# Patient Record
Sex: Female | Born: 1965 | Race: White | Hispanic: No | Marital: Married | Smoking: Never smoker
Health system: Southern US, Community
[De-identification: ages and names within clinical notes are randomized; demographics above are authoritative.]

## PROBLEM LIST (undated history)

## (undated) HISTORY — PX: ABDOMINAL HYSTERECTOMY: SHX81

---

## 1998-03-27 ENCOUNTER — Other Ambulatory Visit: Admission: RE | Admit: 1998-03-27 | Discharge: 1998-03-27 | Payer: Self-pay | Admitting: Obstetrics and Gynecology

## 1999-03-31 ENCOUNTER — Other Ambulatory Visit: Admission: RE | Admit: 1999-03-31 | Discharge: 1999-03-31 | Payer: Self-pay | Admitting: Obstetrics and Gynecology

## 2000-04-08 ENCOUNTER — Other Ambulatory Visit: Admission: RE | Admit: 2000-04-08 | Discharge: 2000-04-08 | Payer: Self-pay | Admitting: Obstetrics and Gynecology

## 2001-05-05 ENCOUNTER — Other Ambulatory Visit: Admission: RE | Admit: 2001-05-05 | Discharge: 2001-05-05 | Payer: Self-pay | Admitting: Obstetrics and Gynecology

## 2002-04-10 ENCOUNTER — Encounter: Admission: RE | Admit: 2002-04-10 | Discharge: 2002-04-10 | Payer: Self-pay | Admitting: Obstetrics and Gynecology

## 2002-04-10 ENCOUNTER — Encounter: Payer: Self-pay | Admitting: Obstetrics and Gynecology

## 2002-07-03 ENCOUNTER — Other Ambulatory Visit: Admission: RE | Admit: 2002-07-03 | Discharge: 2002-07-03 | Payer: Self-pay | Admitting: Obstetrics and Gynecology

## 2003-09-27 ENCOUNTER — Other Ambulatory Visit: Admission: RE | Admit: 2003-09-27 | Discharge: 2003-09-27 | Payer: Self-pay | Admitting: Obstetrics and Gynecology

## 2004-10-21 ENCOUNTER — Other Ambulatory Visit: Admission: RE | Admit: 2004-10-21 | Discharge: 2004-10-21 | Payer: Self-pay | Admitting: Obstetrics and Gynecology

## 2005-11-18 ENCOUNTER — Other Ambulatory Visit: Admission: RE | Admit: 2005-11-18 | Discharge: 2005-11-18 | Payer: Self-pay | Admitting: Obstetrics and Gynecology

## 2007-03-02 ENCOUNTER — Encounter (INDEPENDENT_AMBULATORY_CARE_PROVIDER_SITE_OTHER): Payer: Self-pay | Admitting: Obstetrics and Gynecology

## 2007-03-02 ENCOUNTER — Ambulatory Visit (HOSPITAL_COMMUNITY): Admission: RE | Admit: 2007-03-02 | Discharge: 2007-03-02 | Payer: Self-pay | Admitting: Obstetrics and Gynecology

## 2007-08-16 ENCOUNTER — Encounter: Admission: RE | Admit: 2007-08-16 | Discharge: 2007-08-24 | Payer: Self-pay | Admitting: Sports Medicine

## 2008-08-22 ENCOUNTER — Ambulatory Visit: Payer: Self-pay | Admitting: Diagnostic Radiology

## 2008-08-22 ENCOUNTER — Ambulatory Visit (HOSPITAL_BASED_OUTPATIENT_CLINIC_OR_DEPARTMENT_OTHER): Admission: RE | Admit: 2008-08-22 | Discharge: 2008-08-22 | Payer: Self-pay | Admitting: Family Medicine

## 2010-12-15 NOTE — H&P (Signed)
Casey Hammond, Casey Hammond            ACCOUNT NO.:  [id number]   MEDICAL RECORD NO.:  [id number]          PATIENT TYPE:  AMB   LOCATION:  SDC                           FACILITY:  [facility]   PHYSICIAN:  Guy Sandifer. Henderson Cloud, M.D. DATE OF BIRTH:  Jul 11, 1966   DATE OF ADMISSION:  03/02/2007  DATE OF DISCHARGE:                              HISTORY & PHYSICAL   CHIEF COMPLAINT:  Heavy bleeding and leaking urine.   HISTORY OF PRESENT ILLNESS:  The patient is a 45 year old married white  female, G2, P2, having regular but very heavy menses. She also leaks  urine when she coughs and sneezes. Ultrasound in my office on January 04, 2007 reveals a uterus measuring 9.3 x 4.0 x 6.1 cm. There is a 2.4 cm.  intramural fibroid. Sonohysterogram is negative for intracavitary masses  and the ovaries appear normal. Urodynamic studies are consistent with  stress urinary incontinence. After discussion of options, she is being  admitted for laparoscopy with tubal ligation, hysteroscopy, D&C,  NovaSure endometrial ablation, and trans-obturator mid-urethral sling.  Potential risks and complications have been reviewed with the patient  preoperatively.   PAST MEDICAL HISTORY:  Esophageal reflux.   PAST SURGICAL HISTORY:  Negative.   FAMILY HISTORY:  Coronary artery disease in mother, maternal  grandfather, and paternal grandfather. Chronic hypertension in mother.  Lung cancer, aunt.   OBSTETRIC HISTORY:  Vaginal delivery x2.   SOCIAL HISTORY:  Denies tobacco, alcohol, or drug abuse.   MEDICATIONS:  Aspirin and Claritin p.r.n.   ALLERGIES:  PENICILLIN.   REVIEW OF SYSTEMS:  NEUROLOGIC:  Denies headache. CARDIAC:  Denies chest  pain. PULMONARY:  Denies shortness of breath. GASTROINTESTINAL:  Denies  recent changes in bowel habits.   PHYSICAL EXAMINATION:  VITAL SIGNS:  Height 5 feet 4 inches. Weight  125.6 pounds. Blood pressure 104/68.  HEENT:  Without thyromegaly.  LUNGS:  Clear to auscultation.  HEART:   Regular rate and rhythm.  BACK:  Without CVA tenderness.  BREAST:  Without mass or discharge.  ABDOMEN:  Soft, nontender, without masses.  PELVIC:  Vulva, vagina, and cervix without lesion.  UTERUS:  Upper normal size, mobile, and nontender. Adnexa nontender  without masses.  EXTREMITIES:  Grossly within normal limits.  NEUROLOGIC:  Grossly within normal limits.   ASSESSMENT:  1. Menorrhagia.  2. Stress urinary incontinence.   PLAN:  Laparoscopy tubal ligation with Filshie clips, hysteroscopy, D&C,  NovaSure endometrial ablation and trans-obturator mid urethral sling.      Guy Sandifer Henderson Cloud, M.D.  Electronically Signed     JET/MEDQ  D:  02/13/2007  T:  02/13/2007  Job:  [id number]

## 2010-12-15 NOTE — Op Note (Signed)
Casey Hammond, Casey Hammond            ACCOUNT NO.:  [id number]   MEDICAL RECORD NO.:  [id number]          PATIENT TYPE:  AMB   LOCATION:  SDC                           FACILITY:  [facility]   PHYSICIAN:  Guy Sandifer. Henderson Cloud, M.D. DATE OF BIRTH:  14-Jun-1966   DATE OF PROCEDURE:  03/02/2007  DATE OF DISCHARGE:                               OPERATIVE REPORT   PREOPERATIVE DIAGNOSIS:  1. Menorrhagia.  2. Stress urinary continence  3. Desires permanent sterilization.   POSTOPERATIVE DIAGNOSIS:  1. Menorrhagia.  2. Stress urinary continence  3. Desires permanent sterilization.  4. Pelvic adhesions.   PROCEDURE:  Transobturator mid urethral sling, cystoscopy, NovaSure  endometrial ablation, hysteroscopy, dilatation and curettage,  laparoscopy with bilateral tubal ligation with Filshie clips, lysis of  adhesions, and 1% Xylocaine paracervical block.   SURGEON:  Guy Sandifer. Henderson Cloud, M.D.   ANESTHESIA:  General with endotracheal intubation.   SPECIMENS:  Endometrial curettings.   ESTIMATED BLOOD LOSS:  50 mL.   INPUT AND OUTPUT OF DISTENDING MEDIA:  75 mL deficit with part of that  on the floor.   INDICATIONS AND CONSENT:  This patient is a 45 year old married white  female G2, P2, having irregular heavy menses.  She is also having  complaints of leaking urine.  Urodynamics is consistent with stress  urinary incontinence.  Details are dictated in the history and physical.  After discussing the options, she is being admitted for hysteroscopy,  D&C, NovaSure endometrial ablation, transobturator mid urethral sling  with cystoscopy, laparoscopy, tubal ligation with Filshie clips.  The  potential risks and complications have been reviewed preoperatively  including but not limited to infection, organ damage, bleeding requiring  transfusion of blood products with possible transfusion reaction, HIV  and hepatitis acquisition, DVT, PE, pneumonia, fistula formation,  laparotomy, possible vaginal  erosion, dyspareunia, pelvic pain, urinary  retention, prolonged catheterization, prolonged self-catheterization, or  return to the operating room.  All questions have been answered and  consent is signed on the chart.   FINDINGS:  The uterine cavity is without abnormal structure.  The  fallopian tube ostia are identified bilaterally.  Abdominally, the upper  abdomen is grossly normal.  The uterus is six weeks in size.  The  anterior and posterior cul-de-sacs are without lesion.  Right tube and  ovary normal.  Left fallopian tube was normal.  Left ovary has a thick  adhesion to the posterior cul-de-sac.   DESCRIPTION OF PROCEDURE:  The patient is taken to the operating room  where she is identified, placed in the dorsal supine position, and  general anesthesia was induced via endotracheal intubation.  She is then  placed in the dorsal lithotomy position where she is prepped abdominally  and vaginally and draped in a sterile fashion.  The bladder is straight  catheterized, as well.  A bivalve speculum is placed in the vagina.  The  anterior cervical lip is injected with 1% Xylocaine and grasped with a  single tooth tenaculum.  A paracervical block with 1% plain Xylocaine is  placed at the 2, 4, 5, 7, 8, and 10 o'clock positions with approximately  20 mL total.  The uterine cervical length as well as the uterine cavity  length is measured.  The cervix was gently progressively dilated to a 31  dilator.  The diagnostic hysteroscope was placed in the endocervical  canal and advanced under direct visualization using distending media.  The above findings were noted.  The hysteroscope is withdrawn and sharp  curettage is carried out.  Reinspection with the hysteroscope reveals  the cavity to be clean.  The hysteroscope is again withdrawn.   The NovaSure endometrial ablation device is then placed.  After passing  the cavity test on the first attempt, endometrial ablation is carried  out for  approximately 90 seconds.  The device is then removed and seen  to be intact.  Reinspection with the hysteroscope again reveals the  cavity to be clean with no evidence of perforation and good ablated  effect throughout the cavity.  The bivalve speculum was then removed and  a weighted speculum was placed.  A Foley catheter was placed and the  bladder was drained.   The areas for incision for the transobturator skin incisions are marked.  Those areas along with the infraurethral vaginal mucosa are injected  with 0.5% lidocaine with 1:200,000 epinephrine.  The vulvar incisions  were made.  The anterior vaginal mucosa is incised under the urethra.  Dissection is carried out bilaterally.  Then, using the halo  transobturator needles, they are placed through both incisions to the  transobturator fossa.  Care was taken to keep the needle tip on the  pelvic bone and the needle tip passages followed with the examining  finger.  Both needles are exited through the suburethral incision.  There is no evidence of penetrating the vaginal mucosa.  The Foley  catheter is then removed.  Cystoscopy is carried out with the 70 degrees  scope.  A 360 degrees inspection reveals no evidence of foreign body  perforation.  A good puff of urine from the bilateral ureteral orifices  are noted.  The cystoscope is removed.  The Foley catheter is replaced  and the bladder is drained.  The sling is then placed on the needle tips  and withdrawn back through the skin incisions.  The sheath is then  removed.  Proper tensioning is placed by adjusting, noting that a Kelly  clamp placed under the sling can easily be rotated perpendicular to the  floor without any tension on the sling.  The vaginal mucosa is closed  with interrupted Monocryl suture.  The sling is trimmed at the level of  the skin and the skin incisions were closed with Dermabond.  The Foley  catheter was left in place and drained to a bag.   Attention is  then turned to the abdomen.  The infraumbilical area is  injected with 0.5% Marcaine as well as the suprapubic area in the  midline.  A small infraumbilical incision is made.  A disposable Veress  needle was placed on the first attempt without difficulty.  A normal  syringe drop test are noted.  2 liters of gas were insufflated under low  pressure with good tympany in the right upper quadrant.  The Veress  needle was removed.  A 10/11 XL bladeless disposable trocar sleeve was  then placed using direct visualization with the diagnostic laparoscopic.  After placement, the operative laparoscope was placed.  A small  suprapubic incision is made in the midline and a 5 mm XL bladeless  disposable trocar sleeve was placed under  direct visualization without  difficulty.  The above findings were noted.   The adhesive band of the left ovary is cauterized in its mid point with  bipolar cautery.  It is then taken down sharply without difficulty.  Good hemostasis is noted.  The right fallopian tube is identified from  cornu to fimbria.  A Filshie clip is placed on the proximal portion of  the fallopian tube.  A similar procedure is carried out on the left.  The Filshie clip applicator is then removed and careful inspection  reveals the entire width of the tube to be within the clip and heel of  the clip could be seen to the mesosalpinx bilaterally.  Good hemostasis  is noted all around.  The suprapubic trocar and sleeve was removed,  pneumoperitoneum is reduced, and the umbilical trocar sleeve was  removed.  The skin incision on the umbilicus was closed with interrupted  2-0 Vicryl.  Dermabond was placed on both incisions.  The Hulka  tenaculum is removed and no bleeding is noted.  All counts were correct.  The patient is awakened and taken to the recovery room in stable  condition.      Guy Sandifer Henderson Cloud, M.D.  Electronically Signed     JET/MEDQ  D:  03/02/2007  T:  03/02/2007  Job:  [id number]

## 2011-05-17 LAB — COMPREHENSIVE METABOLIC PANEL
ALT: 13
AST: 19
Albumin: 4.2
Alkaline Phosphatase: 49
BUN: 10
CO2: 26
Calcium: 9.5
Chloride: 108
Creatinine, Ser: 0.8
GFR calc Af Amer: >60
GFR calc non Af Amer: >60
Glucose, Bld: 95
Potassium: 4
Sodium: 139
Total Bilirubin: 1.2
Total Protein: 7.5

## 2011-05-17 LAB — CBC
HCT: 39.8
Hemoglobin: 13.3
MCHC: 33.5
MCV: 88.6
Platelets: 211
RBC: 4.5
RDW: 12.8
WBC: 5

## 2011-05-17 LAB — HCG, SERUM, QUALITATIVE: Preg, Serum: NEGATIVE

## 2012-12-12 ENCOUNTER — Other Ambulatory Visit: Payer: Self-pay | Admitting: Obstetrics and Gynecology

## 2014-11-15 ENCOUNTER — Other Ambulatory Visit: Payer: Self-pay | Admitting: Obstetrics and Gynecology

## 2014-11-18 LAB — CYTOLOGY - PAP

## 2014-11-19 ENCOUNTER — Other Ambulatory Visit: Payer: Self-pay | Admitting: Obstetrics and Gynecology

## 2014-11-19 DIAGNOSIS — R928 Other abnormal and inconclusive findings on diagnostic imaging of breast: Secondary | ICD-10-CM

## 2014-11-26 ENCOUNTER — Ambulatory Visit
Admission: RE | Admit: 2014-11-26 | Discharge: 2014-11-26 | Disposition: A | Discharge status: Home / Self Care | Payer: BC Managed Care – PPO | Source: Ambulatory Visit | Attending: Obstetrics and Gynecology | Admitting: Obstetrics and Gynecology

## 2014-11-26 DIAGNOSIS — R928 Other abnormal and inconclusive findings on diagnostic imaging of breast: Secondary | ICD-10-CM

## 2014-12-02 ENCOUNTER — Other Ambulatory Visit: Payer: Self-pay

## 2016-04-22 DIAGNOSIS — Z0000 Encounter for general adult medical examination without abnormal findings: Secondary | ICD-10-CM | POA: Insufficient documentation

## 2017-01-03 DIAGNOSIS — G43709 Chronic migraine without aura, not intractable, without status migrainosus: Secondary | ICD-10-CM | POA: Insufficient documentation

## 2017-01-03 DIAGNOSIS — IMO0002 Reserved for concepts with insufficient information to code with codable children: Secondary | ICD-10-CM | POA: Insufficient documentation

## 2017-03-17 DIAGNOSIS — F419 Anxiety disorder, unspecified: Secondary | ICD-10-CM | POA: Insufficient documentation

## 2018-04-03 ENCOUNTER — Ambulatory Visit: Payer: Self-pay | Admitting: Adult Health

## 2018-04-03 VITALS — BP 113/67 | HR 66 | Temp 98.1°F | Resp 16 | Wt 127.8 lb

## 2018-04-03 DIAGNOSIS — H6692 Otitis media, unspecified, left ear: Secondary | ICD-10-CM

## 2018-04-03 DIAGNOSIS — J029 Acute pharyngitis, unspecified: Secondary | ICD-10-CM

## 2018-04-03 MED ORDER — AZITHROMYCIN 250 MG PO TABS: ORAL_TABLET | ORAL | 0 refills | Status: DC

## 2018-04-03 NOTE — Progress Notes (Signed)
Subjective:     Patient ID: Casey Hammond, female   DOB: 1965/12/17, 52 y.o.   MRN: [id number]  HPI  Blood pressure 113/67, pulse 66, temperature 98.1 F (36.7 C), temperature source Tympanic, resp. rate 16, weight 127 lb 12.8 oz (58 kg), SpO2 100 %. Patient is a 52 year old female in no acute distress who comes to the clinic with complaints of mild fatigue, cold symptoms for past two weeks. She reports sore throat pain. Denies any difficulty swallowing.   Patient  denies any fever, body aches,chills, rash, chest pain, shortness of breath, nausea, vomiting, or diarrhea.  Mild diarrhea intermittent with gluten and daily- denies any today.  She has been taking Zyrtec daily for years.   Patient  denies any fever, body aches,chills, rash, chest pain, shortness of breath, nausea, vomiting, or diarrhea.    Allergies  Allergen Reactions  . Penicillins Hives    Unknown Unknown Unknown   . Pseudoephedrine Hcl     Other reaction(s): Other (See Comments) Unable to sleep Unable to sleep Unable to sleep Unable to sleep Unable to sleep   . Gluten Meal    Review of Systems  Constitutional: Positive for fatigue. Negative for activity change, appetite change, chills, diaphoresis, fever and unexpected weight change.  HENT: Positive for congestion, ear pain, postnasal drip, sinus pressure and sore throat. Negative for dental problem, drooling, ear discharge, facial swelling, hearing loss, mouth sores, nosebleeds, rhinorrhea, sinus pain, sneezing, tinnitus, trouble swallowing and voice change.   Eyes: Negative.   Respiratory: Negative.   Cardiovascular: Negative.   Gastrointestinal: Negative.   Musculoskeletal: Negative.   Skin: Negative.   Allergic/Immunologic:        -- Penicillins -- Hives   --  Unknown            Unknown            Unknown  -- Pseudoephedrine Hcl    --  Other reaction(s): Other (See Comments)            Unable to sleep            Unable to sleep             Unable to sleep            Unable to sleep            Unable to sleep  -- Gluten Meal    Neurological: Negative.   Hematological: Negative.   Psychiatric/Behavioral: Negative.        Objective:   Physical Exam  Constitutional: She is oriented to person, place, and time. She appears well-developed and well-nourished. She is active. No distress.  Patient is alert and oriented and responsive to questions Engages in eye contact with provider. Speaks in full sentences without any pauses without any shortness of breath or distress.    HENT:  Head: Normocephalic and atraumatic.  Right Ear: Hearing and external ear normal. Tympanic membrane is not perforated and not erythematous. A middle ear effusion is present.  Left Ear: Hearing and external ear normal. Tympanic membrane is erythematous. Tympanic membrane is not perforated. A middle ear effusion is present.  Nose: Rhinorrhea present. No mucosal edema. Right sinus exhibits no maxillary sinus tenderness and no frontal sinus tenderness. Left sinus exhibits no maxillary sinus tenderness and no frontal sinus tenderness.  Mouth/Throat: Uvula is midline and mucous membranes are normal. No uvula swelling. Oropharyngeal exudate and posterior oropharyngeal erythema present. No posterior oropharyngeal edema or tonsillar abscesses.  Eyes: Pupils are equal, round, and reactive to light. Conjunctivae and EOM are normal. Right eye exhibits no discharge. Left eye exhibits no discharge. No scleral icterus.  Neck: Normal range of motion. Neck supple. No JVD present. No tracheal deviation present.  Cardiovascular: Normal rate, regular rhythm, normal heart sounds and intact distal pulses. Exam reveals no gallop and no friction rub.  No murmur heard. Pulmonary/Chest: Effort normal and breath sounds normal. No stridor. No respiratory distress. She has no wheezes. She has no rales. She exhibits no tenderness.  Abdominal: Soft. Bowel sounds are normal.   Musculoskeletal: Normal range of motion.  Lymphadenopathy:    She has no cervical adenopathy.  Neurological: She is alert and oriented to person, place, and time. She displays normal reflexes. No cranial nerve deficit. She exhibits normal muscle tone. Coordination normal.  Skin: Skin is warm and dry. No rash noted. She is not diaphoretic. No erythema. No pallor.  Psychiatric: She has a normal mood and affect. Her behavior is normal. Judgment and thought content normal.  Vitals reviewed.      Assessment:     Pharyngitis, unspecified etiology  Left otitis media, unspecified otitis media type      Plan:          Meds ordered this encounter  Medications  . azithromycin (ZITHROMAX) 250 MG tablet    Sig: By mouth Take 2 tablets day 1 (500mg  total) and 1 tablet ( 250 mg ) on days 2,3,4,5.    Dispense:  6 tablet    Refill:  0   Suggest adding Flonase daily per package instructions over the counter. Also possibly discontinuing Zyrtec daily since you have been on for years and starting Allegra 90 mg daily per package instructions.   Advised patient call the office or your primary care doctor for an appointment if no improvement within 72 hours or if any symptoms change or worsen at any time  Advised ER or urgent Care if after hours or on weekend. Call 911 for emergency symptoms at any time.Patinet verbalized understanding of all instructions given/reviewed and treatment plan and has no further questions or concerns at this time.    Patient verbalized understanding of all instructions given and denies any further questions at this time.

## 2018-04-03 NOTE — Patient Instructions (Addendum)
Pharyngitis Pharyngitis is a sore throat (pharynx). There is redness, pain, and swelling of your throat. Follow these instructions at home:  Drink enough fluids to keep your pee (urine) clear or pale yellow.  Only take medicine as told by your doctor. ? You may get sick again if you do not take medicine as told. Finish your medicines, even if you start to feel better. ? Do not take aspirin.  Rest.  Rinse your mouth (gargle) with salt water ( tsp of salt per 1 qt of water) every 1-2 hours. This will help the pain.  If you are not at risk for choking, you can suck on hard candy or sore throat lozenges. Contact a doctor if:  You have large, tender lumps on your neck.  You have a rash.  You cough up green, yellow-brown, or bloody spit. Get help right away if:  You have a stiff neck.  You drool or cannot swallow liquids.  You throw up (vomit) or are not able to keep medicine or liquids down.  You have very bad pain that does not go away with medicine.  You have problems breathing (not from a stuffy nose). This information is not intended to replace advice given to you by your health care provider. Make sure you discuss any questions you have with your health care provider. Document Released: 01/05/2008 Document Revised: 12/25/2015 Document Reviewed: 03/26/2013 Elsevier Interactive Patient Education  2017 [employer] Otitis Media, Adult Otitis media is redness, soreness, and puffiness (swelling) in the space just behind your eardrum (middle ear). It may be caused by allergies or infection. It often happens along with a cold. Follow these instructions at home:  Take your medicine as told. Finish it even if you start to feel better.  Only take over-the-counter or prescription medicines for pain, discomfort, or fever as told by your doctor.  Follow up with your doctor as told. Contact a doctor if:  You have otitis media only in one ear, or bleeding from your nose, or  both.  You notice a lump on your neck.  You are not getting better in 3-5 days.  You feel worse instead of better. Get help right away if:  You have pain that is not helped with medicine.  You have puffiness, redness, or pain around your ear.  You get a stiff neck.  You cannot move part of your face (paralysis).  You notice that the bone behind your ear hurts when you touch it. This information is not intended to replace advice given to you by your health care provider. Make sure you discuss any questions you have with your health care provider. Document Released: 01/05/2008 Document Revised: 12/25/2015 Document Reviewed: 02/13/2013 Elsevier Interactive Patient Education  2017 [employer]. Allergies An allergy is when your body reacts to a substance in a way that is not normal. An allergic reaction can happen after you:  Eat something.  Breathe in something.  Touch something.  You can be allergic to:  Things that are only around during certain seasons, like molds and pollens.  Foods.  Drugs.  Insects.  Animal dander.  What are the signs or symptoms?  Puffiness (swelling). This may happen on the lips, face, tongue, mouth, or throat.  Sneezing.  Coughing.  Breathing loudly (wheezing).  Stuffy nose.  Tingling in the mouth.  A rash.  Itching.  Itchy, red, puffy areas of skin (hives).  Watery eyes.  Throwing up (vomiting).  Watery poop (diarrhea).  Dizziness.  Feeling  faint or fainting.  Trouble breathing or swallowing.  A tight feeling in the chest.  A fast heartbeat. How is this diagnosed? Allergies can be diagnosed with:  A medical and family history.  Skin tests.  Blood tests.  A food diary. A food diary is a record of all the foods, drinks, and symptoms you have each day.  The results of an elimination diet. This diet involves making sure not to eat certain foods and then seeing what happens when you start eating them  again.  How is this treated? There is no cure for allergies, but allergic reactions can be treated with medicine. Severe reactions usually need to be treated at a hospital. How is this prevented? The best way to prevent an allergic reaction is to avoid the thing you are allergic to. Allergy shots and medicines can also help prevent reactions in some cases. This information is not intended to replace advice given to you by your health care provider. Make sure you discuss any questions you have with your health care provider. Document Released: 11/13/2012 Document Revised: 03/15/2016 Document Reviewed: 04/30/2014 Elsevier Interactive Patient Education  [employer].

## 2018-04-12 ENCOUNTER — Ambulatory Visit: Payer: Self-pay | Admitting: Medical

## 2018-04-12 VITALS — BP 113/67 | HR 63 | Temp 97.8°F | Resp 16 | Wt 129.0 lb

## 2018-04-12 DIAGNOSIS — H6993 Unspecified Eustachian tube disorder, bilateral: Secondary | ICD-10-CM

## 2018-04-12 DIAGNOSIS — H6983 Other specified disorders of Eustachian tube, bilateral: Secondary | ICD-10-CM

## 2018-04-12 MED ORDER — PREDNISONE 10 MG (21) PO TBPK: ORAL_TABLET | ORAL | 0 refills | Status: DC

## 2018-04-12 NOTE — Progress Notes (Signed)
   Subjective:    Patient ID: Casey Hammond, female    DOB: [birth year], 52 y.o.   MRN: [id number]  HPI 52 yo female in non acute distress. Comes in today for recheck of ears.  Treated on 04/03/18 with Azithromycin for pharyngitis and left otitis media. Throat is better, feels exhausted and  ears feel pressure and fullness bilaterally. Still some sinus pressure.  Using Allegra and Flonase as directed but has forgot over last  Couple of days.    Blood pressure 113/67, pulse 63, temperature 97.8 F (36.6 C), temperature source Oral, resp. rate 16, weight 129 lb (58.5 kg), SpO2 100 %.  Review of Systems  Constitutional: Positive for fatigue. Negative for chills and fever.  HENT: Positive for ear pain (fullness and pressure) and sinus pressure (discomfort). Negative for congestion and sore throat.   Eyes: Negative for discharge and itching.  Respiratory: Negative for cough and shortness of breath.   Cardiovascular: Negative for chest pain.  Gastrointestinal: Negative for abdominal pain.  Genitourinary: Negative for dysuria.  Musculoskeletal: Positive for myalgias (poswibly from yoga on Monday, also got a flu vaccine yesterday on the left side and that feels sore.).  Skin: Negative for rash.  Neurological: Positive for headaches (hx of migraines). Negative for dizziness, syncope and light-headedness.  Hematological: Negative for adenopathy.  Psychiatric/Behavioral: Negative for behavioral problems, confusion, self-injury and suicidal ideas.   Using  Allegra and Fluticasone daily.     Objective:   Physical Exam  Constitutional: She is oriented to person, place, and time. She appears well-developed and well-nourished.  HENT:  Head: Normocephalic and atraumatic.  Right Ear: Hearing, external ear and ear canal normal. A middle ear effusion is present.  Left Ear: Hearing, external ear and ear canal normal. A middle ear effusion is present.  Nose: Mucosal edema (mild left side) present. No  rhinorrhea.  Mouth/Throat: Oropharynx is clear and moist and mucous membranes are normal. Uvula swelling (most likely from post nasal drip) present. Tonsils are 0 on the right. Tonsils are 0 on the left.  Eyes: Pupils are equal, round, and reactive to light. Conjunctivae and EOM are normal.  Neck: Normal range of motion. Neck supple.  Cardiovascular: Normal rate, regular rhythm and normal heart sounds.  Pulmonary/Chest: Effort normal and breath sounds normal.  Lymphadenopathy:    She has no cervical adenopathy.  Neurological: She is alert and oriented to person, place, and time.  Skin: Skin is warm and dry.  Psychiatric: She has a normal mood and affect. Her behavior is normal. Judgment and thought content normal.  Nursing note and vitals reviewed.         Assessment & Plan:  Eustachian tube dysfunction bilateral Follow up with primary doctor I 7-10 days if not improving. Primary doctor Dr.  Jefm Bryant in [place].  Coninue to stay on Allegra and Fluticasone nasal spray as directed. Meds ordered this encounter  Medications  . predniSONE (STERAPRED UNI-PAK 21 TAB) 10 MG (21) TBPK tablet    Sig: Take 6 tablets by mouth today then 5 tablets tomorrow then one less each day thereafter. Take with food.    Dispense:  21 tablet    Refill:  0  Patient verbalizes understanding and has no questions at discharge.

## 2018-04-12 NOTE — Patient Instructions (Signed)
Eustachian Tube Dysfunction The eustachian tube connects the middle ear to the back of the nose. It regulates air pressure in the middle ear by allowing air to move between the ear and nose. It also helps to drain fluid from the middle ear space. When the eustachian tube does not function properly, air pressure, fluid, or both can build up in the middle ear. Eustachian tube dysfunction can affect one or both ears. What are the causes? This condition happens when the eustachian tube becomes blocked or cannot open normally. This may result from:  Ear infections.  Colds and other upper respiratory infections.  Allergies.  Irritation, such as from cigarette smoke or acid from the stomach coming up into the esophagus (gastroesophageal reflux).  Sudden changes in air pressure, such as from descending in an airplane.  Abnormal growths in the nose or throat, such as nasal polyps, tumors, or enlarged tissue at the back of the throat (adenoids).  What increases the risk? This condition may be more likely to develop in people who smoke and people who are overweight. Eustachian tube dysfunction may also be more likely to develop in children, especially children who have:  Certain birth defects of the mouth, such as cleft palate.  Large tonsils and adenoids.  What are the signs or symptoms? Symptoms of this condition may include:  A feeling of fullness in the ear.  Ear pain.  Clicking or popping noises in the ear.  Ringing in the ear.  Hearing loss.  Loss of balance.  Symptoms may get worse when the air pressure around you changes, such as when you travel to an area of high elevation or fly on an airplane. How is this diagnosed? This condition may be diagnosed based on:  Your symptoms.  A physical exam of your ear, nose, and throat.  Tests, such as those that measure: ? The movement of your eardrum (tympanogram). ? Your hearing (audiometry).  How is this treated? Treatment  depends on the cause and severity of your condition. If your symptoms are mild, you may be able to relieve your symptoms by moving air into ("popping") your ears. If you have symptoms of fluid in your ears, treatment may include:  Decongestants.  Antihistamines.  Nasal sprays or ear drops that contain medicines that reduce swelling (steroids).  In some cases, you may need to have a procedure to drain the fluid in your eardrum (myringotomy). In this procedure, a small tube is placed in the eardrum to:  Drain the fluid.  Restore the air in the middle ear space.  Follow these instructions at home:  Take over-the-counter and prescription medicines only as told by your health care provider.  Use techniques to help pop your ears as recommended by your health care provider. These may include: ? Chewing gum. ? Yawning. ? Frequent, forceful swallowing. ? Closing your mouth, holding your nose closed, and gently blowing as if you are trying to blow air out of your nose.  Do not do any of the following until your health care provider approves: ? Travel to high altitudes. ? Fly in airplanes. ? Work in a pressurized cabin or room. ? Scuba dive.  Keep your ears dry. Dry your ears completely after showering or bathing.  Do not smoke.  Keep all follow-up visits as told by your health care provider. This is important. Contact a health care provider if:  Your symptoms do not go away after treatment.  Your symptoms come back after treatment.  You are   unable to pop your ears.  You have: ? A fever. ? Pain in your ear. ? Pain in your head or neck. ? Fluid draining from your ear.  Your hearing suddenly changes.  You become very dizzy.  You lose your balance. This information is not intended to replace advice given to you by your health care provider. Make sure you discuss any questions you have with your health care provider. Document Released: 08/15/2015 Document Revised: 12/25/2015  Document Reviewed: 08/07/2014 Elsevier Interactive Patient Education  2018 [employer] Inc[employer]  

## 2018-10-12 ENCOUNTER — Other Ambulatory Visit: Payer: Self-pay

## 2018-10-12 ENCOUNTER — Encounter: Payer: Self-pay | Admitting: Nurse Practitioner

## 2018-10-12 ENCOUNTER — Ambulatory Visit: Payer: Self-pay | Admitting: Nurse Practitioner

## 2018-10-12 VITALS — BP 112/72 | HR 74 | Temp 98.1°F | Resp 16 | Ht 64.0 in | Wt 133.0 lb

## 2018-10-12 DIAGNOSIS — N390 Urinary tract infection, site not specified: Secondary | ICD-10-CM

## 2018-10-12 DIAGNOSIS — L299 Pruritus, unspecified: Secondary | ICD-10-CM

## 2018-10-12 DIAGNOSIS — R350 Frequency of micturition: Secondary | ICD-10-CM

## 2018-10-12 LAB — POCT URINALYSIS DIPSTICK
Appearance: NORMAL
BILIRUBIN UA: NEGATIVE
Glucose, UA: NEGATIVE
KETONES UA: NEGATIVE
NITRITE UA: NEGATIVE
PH UA: 7 (ref 5.0–8.0)
Protein, UA: NEGATIVE
RBC UA: NEGATIVE
SPEC GRAV UA: 1.01 (ref 1.010–1.025)
UROBILINOGEN UA: 0.2 U/dL

## 2018-10-12 MED ORDER — SULFAMETHOXAZOLE-TRIMETHOPRIM 800-160 MG PO TABS: 1.0000 | ORAL_TABLET | Freq: Two times a day (BID) | ORAL | 0 refills | Status: DC

## 2018-10-12 NOTE — Patient Instructions (Addendum)
-Please drink plenty of fluids  -We will call you if we need to change your plan of treatment for the UTI  -Consider using lotion to skin to help hydrate; which may resolve itching  -Encouraged patient to call the office or primary care doctor for an appointment if no improvement in symptoms or if symptoms change or worsen after 72 hours of planned treatment. Patient verbalized understanding of all instructions given/reviewed and has no further questions or concerns at this time.     Urinary Tract Infection, Adult  A urinary tract infection (UTI) is an infection of any part of the urinary tract. The urinary tract includes:  The kidneys.  The ureters.  The bladder.  The urethra. These organs make, store, and get rid of pee (urine) in the body. What are the causes? This is caused by germs (bacteria) in your genital area. These germs grow and cause swelling (inflammation) of your urinary tract. What increases the risk? You are more likely to develop this condition if:  You have a small, thin tube (catheter) to drain pee.  You cannot control when you pee or poop (incontinence).  You are female, and: ? You use these methods to prevent pregnancy: ? A medicine that kills sperm (spermicide). ? A device that blocks sperm (diaphragm). ? You have low levels of a female hormone (estrogen). ? You are pregnant.  You have genes that add to your risk.  You are sexually active.  You take antibiotic medicines.  You have trouble peeing because of: ? A prostate that is bigger than normal, if you are female. ? A blockage in the part of your body that drains pee from the bladder (urethra). ? A kidney stone. ? A nerve condition that affects your bladder (neurogenic bladder). ? Not getting enough to drink. ? Not peeing often enough.  You have other conditions, such as: ? Diabetes. ? A weak disease-fighting system (immune system). ? Sickle cell disease. ? Gout. ? Injury of the  spine. What are the signs or symptoms? Symptoms of this condition include:  Needing to pee right away (urgently).  Peeing often.  Peeing small amounts often.  Pain or burning when peeing.  Blood in the pee.  Pee that smells bad or not like normal.  Trouble peeing.  Pee that is cloudy.  Fluid coming from the vagina, if you are female.  Pain in the belly or lower back. Other symptoms include:  Throwing up (vomiting).  No urge to eat.  Feeling mixed up (confused).  Being tired and grouchy (irritable).  A fever.  Watery poop (diarrhea). How is this treated? This condition may be treated with:  Antibiotic medicine.  Other medicines.  Drinking enough water. Follow these instructions at home:   Medicines  Take over-the-counter and prescription medicines only as told by your doctor.  If you were prescribed an antibiotic medicine, take it as told by your doctor. Do not stop taking it even if you start to feel better. General instructions  Make sure you: ? Pee until your bladder is empty. ? Do not hold pee for a long time. ? Empty your bladder after sex. ? Wipe from front to back after pooping if you are a female. Use each tissue one time when you wipe.  Drink enough fluid to keep your pee pale yellow.  Keep all follow-up visits as told by your doctor. This is important. Contact a doctor if:  You do not get better after 1-2 days.  Your symptoms  go away and then come back. Get help right away if:  You have very bad back pain.  You have very bad pain in your lower belly.  You have a fever.  You are sick to your stomach (nauseous).  You are throwing up. Summary  A urinary tract infection (UTI) is an infection of any part of the urinary tract.  This condition is caused by germs in your genital area.  There are many risk factors for a UTI. These include having a small, thin tube to drain pee and not being able to control when you pee or  poop.  Treatment includes antibiotic medicines for germs.  Drink enough fluid to keep your pee pale yellow. This information is not intended to replace advice given to you by your health care provider. Make sure you discuss any questions you have with your health care provider. Document Released: 01/05/2008 Document Revised: 01/26/2018 Document Reviewed: 01/26/2018 Elsevier Interactive Patient Education  2019 [employer].

## 2018-10-12 NOTE — Progress Notes (Signed)
   Subjective:    Patient ID: Casey Hammond, female    DOB: [birth year], 53 y.o.   MRN: [id number]  HPI Casey Hammond [facility] with complaints of urinary urgency and frequency x1 day.  She does report that she has had a history of a urinary tract infection in the past and this feels the same.  She has not tried anything over-the-counter for her symptoms.  She denies suprapubic pain, back pain, fever, blood in her urine, or dysuria.  She also complains of generalized itching x1 month with no signs of rash or lesions.  She reports she changed her current hair conditioner about a month ago but no new foods no new detergents no new exposures.    Review of Systems  Constitutional: Negative for fatigue and fever.  Genitourinary: Positive for frequency and urgency. Negative for dysuria, flank pain and hematuria.  Skin:       Generalized itching       Objective:   Physical Exam Vitals signs reviewed.  Constitutional:      Appearance: Normal appearance. She is well-developed.  HENT:     Head: Normocephalic and atraumatic.  Neck:     Musculoskeletal: Normal range of motion and neck supple.  Cardiovascular:     Rate and Rhythm: Normal rate and regular rhythm.     Heart sounds: Normal heart sounds.  Pulmonary:     Effort: Pulmonary effort is normal. No respiratory distress.     Breath sounds: Normal breath sounds.  Abdominal:     General: Abdomen is flat. Bowel sounds are normal.     Palpations: Abdomen is soft. There is no mass.     Tenderness: There is no abdominal tenderness.     Hernia: No hernia is present.  Musculoskeletal: Normal range of motion.     Comments: No CVA tenderness  Skin:    General: Skin is warm and dry.     Coloration: Skin is not jaundiced.     Findings: No bruising, erythema, lesion or rash.     Comments: Skin shows significant areas of dryness, no erythema, lesions or rashes noted.  Neurological:     Mental  Status: She is alert and oriented to person, place, and time.  Psychiatric:        Mood and Affect: Mood normal.           Assessment & Plan:

## 2018-10-14 LAB — URINE CULTURE

## 2020-08-06 DIAGNOSIS — Z90710 Acquired absence of both cervix and uterus: Secondary | ICD-10-CM | POA: Insufficient documentation

## 2020-09-18 ENCOUNTER — Other Ambulatory Visit: Payer: Self-pay

## 2020-09-18 ENCOUNTER — Ambulatory Visit: Payer: Self-pay | Admitting: Nurse Practitioner

## 2020-09-18 ENCOUNTER — Ambulatory Visit
Admission: RE | Admit: 2020-09-18 | Discharge: 2020-09-18 | Disposition: A | Discharge status: Home / Self Care | Payer: BC Managed Care – PPO | Source: Ambulatory Visit | Attending: Nurse Practitioner | Admitting: Nurse Practitioner

## 2020-09-18 ENCOUNTER — Ambulatory Visit: Payer: Self-pay

## 2020-09-18 ENCOUNTER — Ambulatory Visit
Admission: RE | Admit: 2020-09-18 | Discharge: 2020-09-18 | Disposition: A | Discharge status: Home / Self Care | Payer: BC Managed Care – PPO | Attending: Nurse Practitioner | Admitting: Nurse Practitioner

## 2020-09-18 VITALS — BP 104/62 | HR 74 | Temp 98.3°F | Resp 16

## 2020-09-18 DIAGNOSIS — R059 Cough, unspecified: Secondary | ICD-10-CM

## 2020-09-18 DIAGNOSIS — Z20822 Contact with and (suspected) exposure to covid-19: Secondary | ICD-10-CM

## 2020-09-18 LAB — POC COVID19 BINAXNOW: SARS Coronavirus 2 Ag: NEGATIVE

## 2020-09-18 NOTE — Progress Notes (Signed)
Subjective:    Patient ID: Casey Hammond, female    DOB: 07/19/1966, 55 y.o.   MRN: [id number]  HPI  55 year old female presented to [facility] today with complaints of heaviness in her chest and intermittent pain under her left breast for the past 2 weeks. She has more recently developed a cough she attributes to allergies. Denies any other systemic symptoms.   Denies smoking.   Recently (05/2020) lost her father to Lung cancer. Father was diagnosed and passed away within two weeks. Mother has alzheimer's.   Patient has a history of anxiety is managed on Zoloft and has a therapist.  OBGYN prescribes Zoloft for patient.   Patient states that she has been under increased stress, is going through the process of estate management for her parents, and she always experiences increased stress at the start of each semester. 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   She exercises by hiking, doing yoga and has a rowing machine. She is able to exercise without chest pain, has had exacerbations when rowing.   She denies any pain into arm or chest.   Denies a respiratory history, had bronchitis as a child. Denies asthma or need for inhalers.   She has been Vaccinated and boosted for COVID-19   Her mother had an MI in her 18s, patient has had multiple cardiovascular workups and has been to ED prior for concern over MI. Prior workups were all negative.   Review of Systems  Constitutional: Negative.   HENT: Negative.   Eyes: Negative.   Respiratory: Positive for cough.   Cardiovascular: Negative.   Genitourinary: Negative.   Musculoskeletal: Positive for myalgias.  Neurological: Negative.    Current Outpatient Medications  Medication Instructions  . fexofenadine (ALLEGRA) 180 mg, Daily  . fluticasone (FLONASE) 50 MCG/ACT nasal spray 2 sprays, Daily  . OnabotulinumtoxinA (BOTOX IJ) Injection, Patient states she gets botox injections for migraine headaches.  . sertraline (ZOLOFT) 100 MG  tablet 1 tablet, Oral, Daily  . topiramate (TOPAMAX) 100 MG tablet 1 tablet, Oral, Daily  . Ubrogepant (UBRELVY PO) Oral, Patients states she takes for rescue treatment of migraines      Objective:   Physical Exam Constitutional:      Appearance: She is well-developed.  HENT:     Head: Normocephalic.  Eyes:     Pupils: Pupils are equal, round, and reactive to light.  Cardiovascular:     Rate and Rhythm: Normal rate and regular rhythm.     Heart sounds: Normal heart sounds.  Pulmonary:     Effort: Pulmonary effort is normal.     Breath sounds: Normal breath sounds.  Chest:     Chest wall: No tenderness.  Neurological:     Mental Status: She is alert.       Recent Results (from the past 2160 hour(s))  POC COVID-19     Status: Normal   Collection Time: 09/18/20  2:15 PM  Result Value Ref Range   SARS Coronavirus 2 Ag Negative Negative    Comment: vaccinate, boosted, symptomatic patient. aware of negative poc results. has appt with Jarrett Ables FNP for further eva.      Assessment & Plan:   Will send for Chest X-RAY and follow up with patient with results:   CLINICAL DATA:  55 year old female with 2 week history of left-sided chest pain and cough.  EXAM: CHEST - 2 VIEW  COMPARISON:  Chest x-ray 08/22/2008.  FINDINGS: Lung volumes are normal.  No consolidative airspace disease. No pleural effusions. No pneumothorax. No pulmonary nodule or mass noted. Pulmonary vasculature and the cardiomediastinal silhouette are within normal limits.  IMPRESSION: No radiographic evidence of acute cardiopulmonary disease.   Electronically Signed   By: Trudie Reed M.D.   On: 09/19/2020 17:00  Left message for patient and sent Mychart message. Recommend follow up with cardiologist if chest discomfort persists.  Seek immediate medical attention for any acutely worsening symptoms

## 2020-09-23 ENCOUNTER — Other Ambulatory Visit: Payer: Self-pay

## 2020-09-23 ENCOUNTER — Telehealth: Payer: Self-pay | Admitting: Medical

## 2020-09-24 ENCOUNTER — Telehealth: Payer: Self-pay | Admitting: Nurse Practitioner

## 2020-09-24 ENCOUNTER — Encounter: Payer: Self-pay | Admitting: Nurse Practitioner

## 2020-09-24 NOTE — Progress Notes (Signed)
Called patient and reviewed CXR. Motheer with  History of heart disease, Patient had been to see cardiology a couple of times all with normal results per patient. Last visit was 7-8 yrs ago.  Patient still with some pressure in chest. Had patient start  EC ASA 81 mg daily. Will discuss with Viviano Simas FNP about cardiolgy f/u.  CLINICAL DATA:  55 year old female with 2 week history of left-sided chest pain and cough.  EXAM: CHEST - 2 VIEW  COMPARISON:  Chest x-ray 08/22/2008.  FINDINGS: Lung volumes are normal. No consolidative airspace disease. No pleural effusions. No pneumothorax. No pulmonary nodule or mass noted. Pulmonary vasculature and the cardiomediastinal silhouette are within normal limits.  IMPRESSION: No radiographic evidence of acute cardiopulmonary disease.   Electronically Signed   By: Trudie Reed M.D.   On: 09/19/2020 17:00  Patient verbalizes understanding and has no further questions at the end of our conversation.

## 2020-09-24 NOTE — Telephone Encounter (Signed)
Left message will send my chart message.

## 2021-04-22 ENCOUNTER — Other Ambulatory Visit: Payer: Self-pay

## 2021-04-22 ENCOUNTER — Ambulatory Visit: Payer: BLUE CROSS/BLUE SHIELD

## 2021-04-22 DIAGNOSIS — Z23 Encounter for immunization: Secondary | ICD-10-CM

## 2021-06-15 ENCOUNTER — Ambulatory Visit: Payer: BC Managed Care – PPO | Admitting: Medical

## 2021-07-06 ENCOUNTER — Other Ambulatory Visit: Payer: Self-pay

## 2021-07-06 ENCOUNTER — Ambulatory Visit: Payer: BC Managed Care – PPO | Admitting: Medical

## 2021-07-06 ENCOUNTER — Encounter: Payer: Self-pay | Admitting: Medical

## 2021-07-06 VITALS — BP 108/80 | HR 71 | Temp 97.7°F | Resp 16

## 2021-07-06 DIAGNOSIS — J0100 Acute maxillary sinusitis, unspecified: Secondary | ICD-10-CM

## 2021-07-06 DIAGNOSIS — Z20822 Contact with and (suspected) exposure to covid-19: Secondary | ICD-10-CM

## 2021-07-06 LAB — POC COVID19 BINAXNOW: SARS Coronavirus 2 Ag: NEGATIVE

## 2021-07-06 MED ORDER — DOXYCYCLINE HYCLATE 100 MG PO TABS
100.0000 mg | ORAL_TABLET | Freq: Two times a day (BID) | ORAL | 0 refills | Status: DC
Start: 2021-07-06 — End: 2022-09-08

## 2021-07-06 NOTE — Patient Instructions (Signed)

## 2021-07-06 NOTE — Progress Notes (Signed)
Subjective:    Patient ID: Casey Hammond, female    DOB: 04/16/1966, 55 y.o.   MRN: [id number]  HPI 55 yo female with scratchy throat x 7 days, stuffy nose, clear discharge, mild HA , cough non productive and fatigue. History of Migraines.  History of left eye infection , treated with drops they did not work so she was treated with a A-pak treated by her eye doctor. Blood pressure 108/80, pulse 71, temperature 97.7 F (36.5 C), temperature source Tympanic, resp. rate 16, SpO2 99 %.  Allergies  Allergen Reactions   Penicillins Hives    Unknown Unknown Unknown    Pseudoephedrine Hcl     Other reaction(s): Other (See Comments) Unable to sleep Unable to sleep Unable to sleep Unable to sleep Unable to sleep    Gluten Meal      Vaccinated  Moderna one booster. Covid-19 [place] Day.Feels she recovered fully. Exposed by friend.  Review of Systems  Constitutional:  Positive for fatigue. Negative for chills and fever.  HENT:  Positive for congestion, ear pain (left ear on occasion), postnasal drip, rhinorrhea (clear), sinus pressure (maxillary), sinus pain (discomfort), sore throat and voice change (mild). Negative for ear discharge and sneezing.   Eyes:  Negative for discharge and itching.  Respiratory:  Positive for cough (nonproductive). Negative for chest tightness, shortness of breath and wheezing.   Gastrointestinal:  Negative for abdominal pain, diarrhea and nausea.  Genitourinary:  Negative for difficulty urinating.  Musculoskeletal:  Negative for myalgias.  Skin:  Negative for color change.  Neurological:  Positive for headaches (migraines tx with botox, helps, pain in face, sleeps with an ice cap helps with facial pain). Negative for dizziness, syncope and light-headedness.  Psychiatric/Behavioral:  The patient is nervous/anxious (seeing cardiologist due to family history EKG was fine, stress test next week.).       Objective:   Physical Exam Vitals and  nursing note reviewed.  Constitutional:      Appearance: Normal appearance.  HENT:     Head: Normocephalic and atraumatic.     Right Ear: Hearing, ear canal and external ear normal. A middle ear effusion is present.     Left Ear: Hearing, ear canal and external ear normal. A middle ear effusion is present.     Mouth/Throat:     Mouth: Mucous membranes are moist.     Pharynx: Oropharynx is clear. Uvula midline. Uvula swelling present. No pharyngeal swelling, oropharyngeal exudate or posterior oropharyngeal erythema.     Tonsils: No tonsillar exudate or tonsillar abscesses.  Eyes:     Extraocular Movements: Extraocular movements intact.     Conjunctiva/sclera: Conjunctivae normal.     Pupils: Pupils are equal, round, and reactive to light.  Cardiovascular:     Rate and Rhythm: Normal rate and regular rhythm.     Heart sounds: Normal heart sounds.  Pulmonary:     Effort: Pulmonary effort is normal.     Breath sounds: Normal breath sounds.  Musculoskeletal:        General: Normal range of motion.     Cervical back: Normal range of motion and neck supple.  Skin:    General: Skin is warm and dry.  Neurological:     General: No focal deficit present.     Mental Status: She is alert and oriented to person, place, and time.  Psychiatric:        Mood and Affect: Mood normal.        Behavior: Behavior normal.  Thought Content: Thought content normal.        Judgment: Judgment normal.     Mildly tender on the right side maxillary face with palpation. Results for orders placed or performed in visit on 07/06/21 (from the past 24 hour(s))  POC COVID-19     Status: Normal   Collection Time: 07/06/21  3:57 PM  Result Value Ref Range   SARS Coronavirus 2 Ag Negative Negative       Assessment & Plan:  Maxillary sinusitis most likely viral Meds ordered this encounter  Medications   doxycycline (VIBRA-TABS) 100 MG tablet    Sig: Take 1 tablet (100 mg total) by mouth 2 (two) times  daily.    Dispense:  14 tablet    Refill:  0    Continue  Zyrtec. antihistimine and flonase  dailyas recommended. Start antibiotic only if facial pain increases or discharge changes from clear to yellow or yellow. Patient verbalizes understanding and has no questions at discharge.

## 2021-07-07 ENCOUNTER — Encounter: Payer: Self-pay | Admitting: Medical

## 2022-01-10 IMAGING — CR DG CHEST 2V
1 series · 2 of 2 positions shown · non-contrast
Comparison: Chest x-ray 08/22/2008.

CLINICAL DATA: 55-year-old female with 2 week history of left-sided
chest pain and cough.

EXAM:
CHEST - 2 VIEW

[Series 1: view not recorded · 0.14mm/px · 2 of 2 slices shown]
[im 1/2]
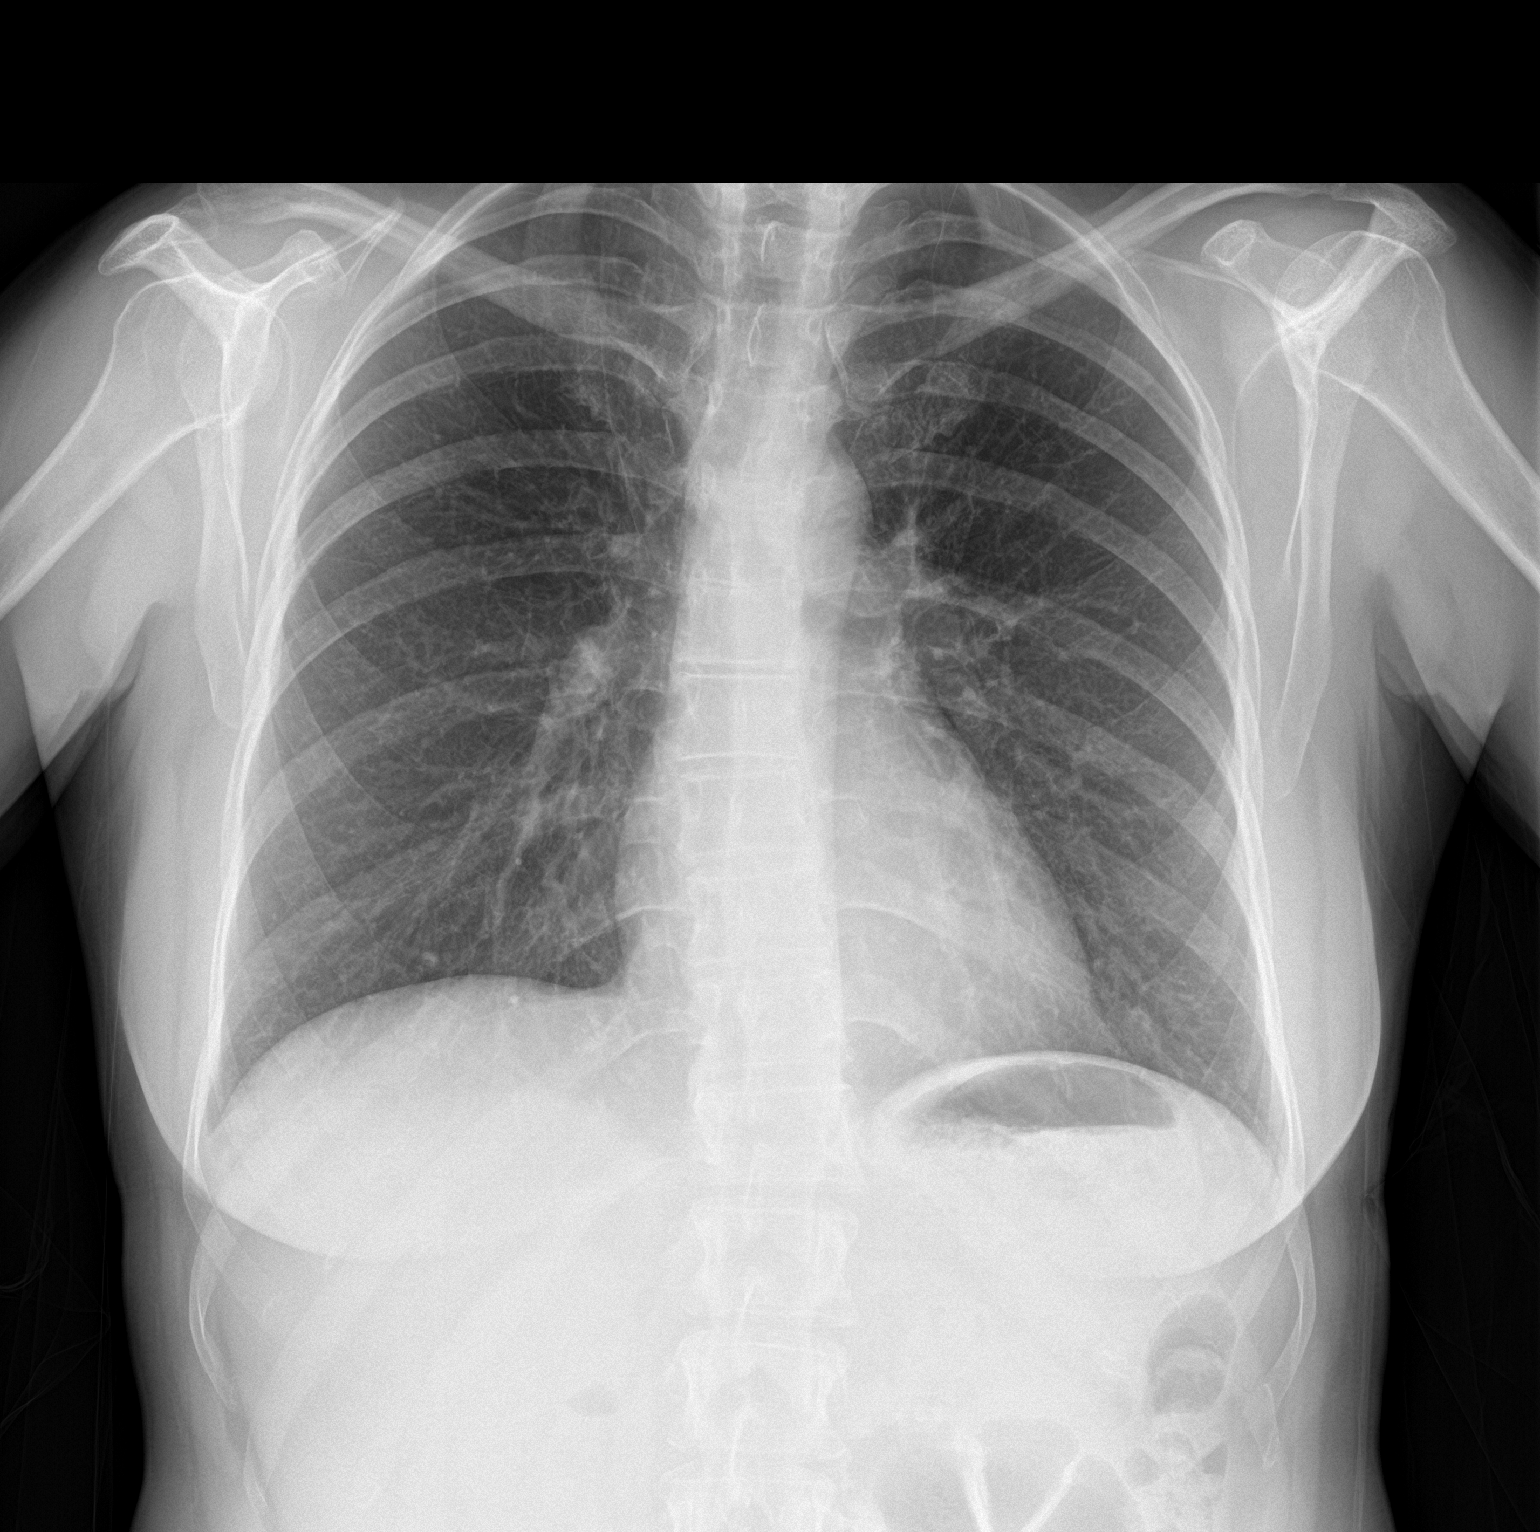
[im 2/2]
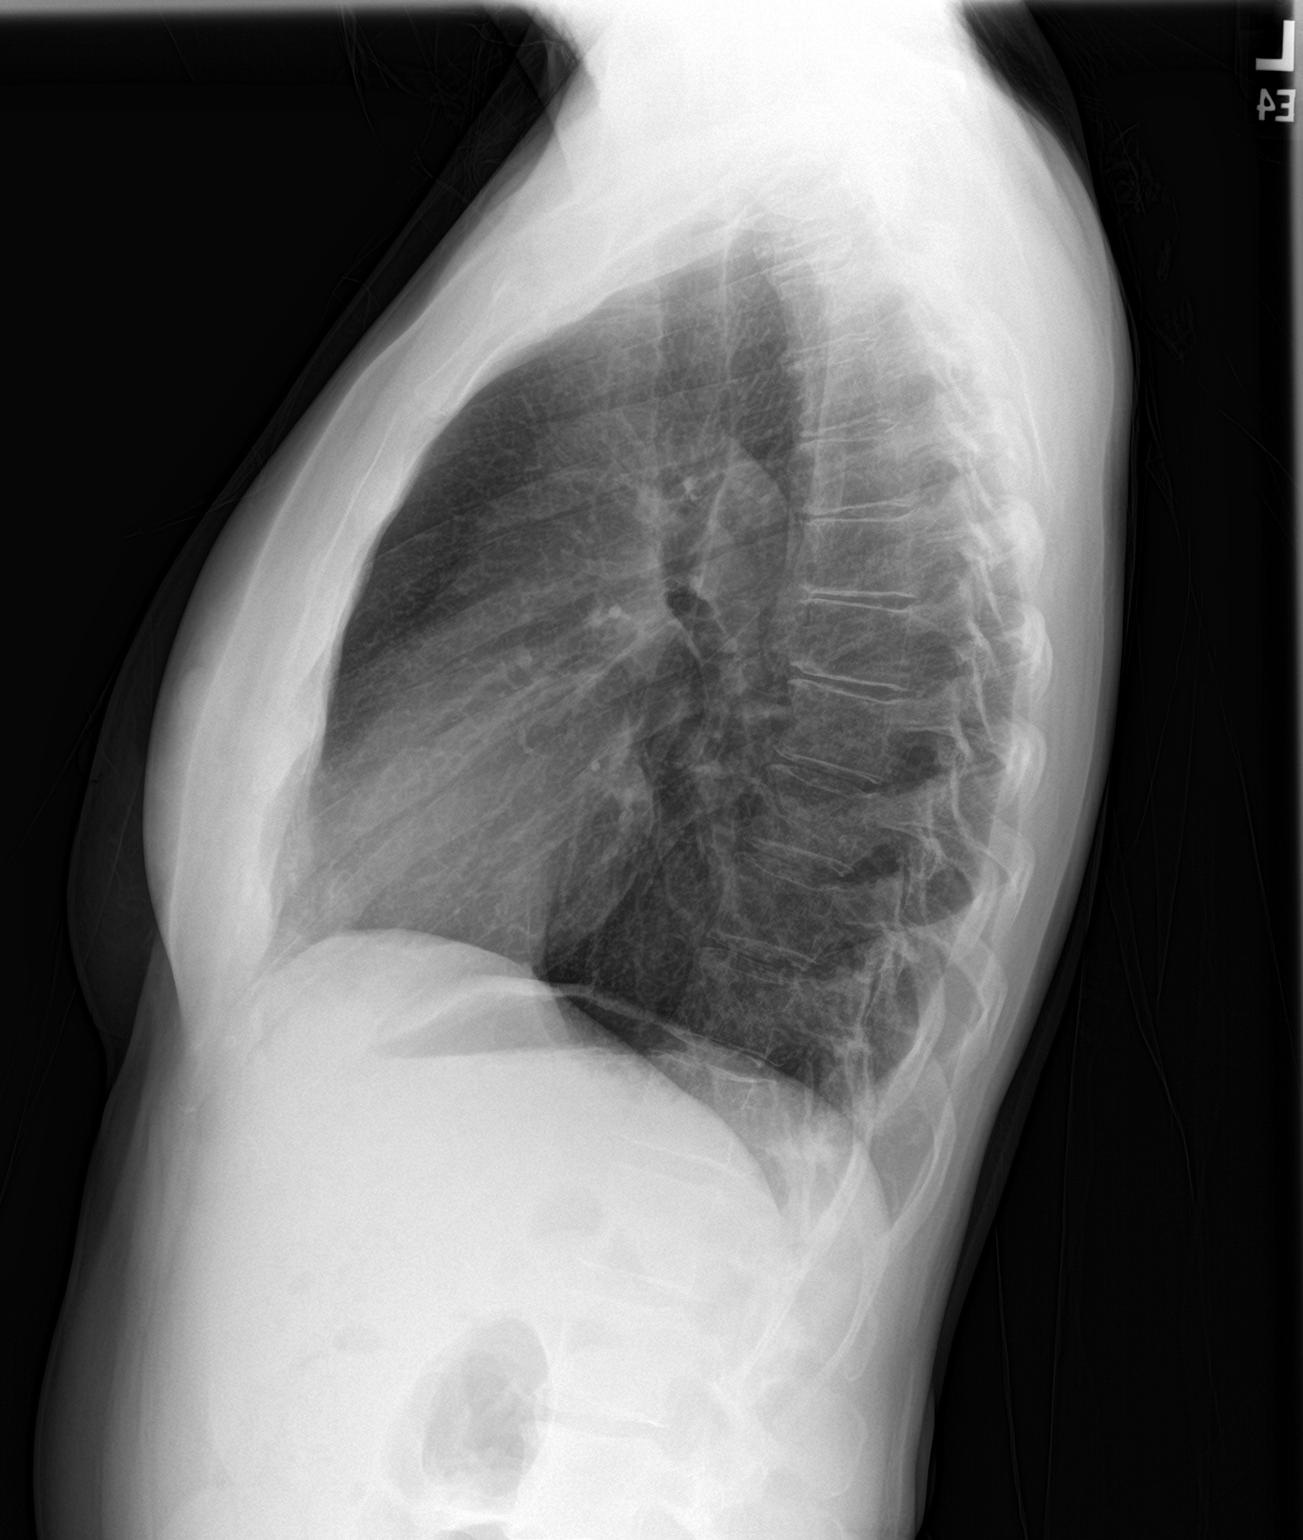

[2 of 2 positions shown; findings below may reference images not displayed]

FINDINGS: Lung volumes are normal. No consolidative airspace disease. No
pleural effusions. No pneumothorax. No pulmonary nodule or mass
noted. Pulmonary vasculature and the cardiomediastinal silhouette
are within normal limits.
IMPRESSION: No radiographic evidence of acute cardiopulmonary disease.

## 2022-09-08 ENCOUNTER — Encounter: Payer: Self-pay | Admitting: Adult Health

## 2022-09-08 ENCOUNTER — Ambulatory Visit (INDEPENDENT_AMBULATORY_CARE_PROVIDER_SITE_OTHER): Payer: Self-pay | Admitting: Adult Health

## 2022-09-08 VITALS — BP 112/64 | HR 73 | Temp 98.6°F | Wt 133.2 lb

## 2022-09-08 DIAGNOSIS — J0140 Acute pansinusitis, unspecified: Secondary | ICD-10-CM

## 2022-09-08 MED ORDER — DOXYCYCLINE HYCLATE 100 MG PO TABS: 100.0000 mg | ORAL_TABLET | Freq: Two times a day (BID) | ORAL | 0 refills | Status: DC

## 2022-09-08 NOTE — Progress Notes (Signed)
[facility] [address], [place] [address]   Office Visit Note  Patient Name: Casey Hammond Date of Birth [id number]  Medical Record number [id number]  Date of Service: 09/08/2022  Chief Complaint  Patient presents with   Sinusitis    Started last Wed. Went to [place] J-term.Congested, coughing, tired, chest congested. No fever, ST. Covid test was neg. Last week.      HPI Pt is here for a sick visit. Patient reports she started feeling bad a week ago. She went to [place] for January term.  She describes congestion, runny nose, sneezing, cough.  She has some sinus pressure and drainage.  She had a low grade fever one day. She took some OTC cold and flu medication.  She has also done sinus rinse.     Current Medication:  Outpatient Encounter Medications as of 09/08/2022  Medication Sig   doxycycline (VIBRA-TABS) 100 MG tablet Take 1 tablet (100 mg total) by mouth 2 (two) times daily.   OnabotulinumtoxinA (BOTOX IJ) Inject as directed. Patient states she gets botox injections for migraine headaches.   sertraline (ZOLOFT) 100 MG tablet Take 1 tablet by mouth daily.   topiramate (TOPAMAX) 100 MG tablet Take 1 tablet by mouth daily.   Ubrogepant (UBRELVY PO) Take by mouth. Patients states she takes for rescue treatment of migraines   fexofenadine (ALLEGRA) 180 MG tablet Take 180 mg by mouth daily. (Patient not taking: Reported on 09/18/2020)   fluticasone (FLONASE) 50 MCG/ACT nasal spray Place 2 sprays into both nostrils daily. (Patient not taking: Reported on 09/18/2020)   [DISCONTINUED] doxycycline (VIBRA-TABS) 100 MG tablet Take 1 tablet (100 mg total) by mouth 2 (two) times daily. (Patient not taking: Reported on 09/08/2022)   No facility-administered encounter medications on file as of 09/08/2022.      Medical History: No past medical history on file.   Vital Signs: BP 112/64 (BP Location: Left Arm, Patient Position: Sitting, Cuff Size: Normal)   Pulse 73   Temp  98.6 F (37 C) (Tympanic)   Wt 133 lb 3.2 oz (60.4 kg)   SpO2 98%   BMI 22.86 kg/m    Review of Systems  Constitutional:  Negative for chills and fatigue.  HENT:  Positive for congestion, rhinorrhea and sinus pressure.   Eyes:  Negative for pain and itching.  Respiratory:  Positive for cough.     Physical Exam Vitals and nursing note reviewed.  Constitutional:      Appearance: Normal appearance.  HENT:     Head: Normocephalic.     Right Ear: Tympanic membrane and ear canal normal.     Left Ear: Tympanic membrane and ear canal normal.     Nose: Congestion present.     Right Turbinates: Enlarged.     Left Turbinates: Enlarged.     Right Sinus: Maxillary sinus tenderness and frontal sinus tenderness present.     Left Sinus: Maxillary sinus tenderness and frontal sinus tenderness present.     Mouth/Throat:     Mouth: Mucous membranes are moist.  Eyes:     Pupils: Pupils are equal, round, and reactive to light.  Pulmonary:     Effort: Pulmonary effort is normal.     Breath sounds: Normal breath sounds.  Lymphadenopathy:     Cervical: No cervical adenopathy.  Neurological:     Mental Status: She is alert.    Assessment/Plan: 1. Acute non-recurrent pansinusitis Patient Instructions: -Take complete course of antibiotics as prescribed.  Take  with food.  -Try Flonase/Fluticasone nasal spray, 2 sprays to each nostril once a day. -You can try using a neti pot or nasal saline rinse product to help clear mucus congestion. -Rest and stay well hydrated (by drinking water and other liquids). Avoid/limit caffeine. -Take over-the-counter medicines (i.e. Mucinex, decongestant, Ibuprofen or Tylenol, cough suppressant) to help relieve your symptoms. -For your cough, use cough drops/throat lozenges, gargle warm salt water and/or drink warm liquids (like tea with honey). -Send my chart message to provider or schedule return visit as needed for new/worsening symptoms or if symptoms do not  improve as discussed with antibiotic and other recommended treatment.   - doxycycline (VIBRA-TABS) 100 MG tablet; Take 1 tablet (100 mg total) by mouth 2 (two) times daily.  Dispense: 20 tablet; Refill: 0     General Counseling: Modena Morrow understanding of the findings of todays visit and agrees with plan of treatment. I have discussed any further diagnostic evaluation that may be needed or ordered today. We also reviewed her medications today. she has been encouraged to call the office with any questions or concerns that should arise related to todays visit.   No orders of the defined types were placed in this encounter.   Meds ordered this encounter  Medications   doxycycline (VIBRA-TABS) 100 MG tablet    Sig: Take 1 tablet (100 mg total) by mouth 2 (two) times daily.    Dispense:  20 tablet    Refill:  0    Time spent:15 Minutes    Kendell Bane AGNP-C Nurse Practitioner

## 2023-05-03 ENCOUNTER — Ambulatory Visit (INDEPENDENT_AMBULATORY_CARE_PROVIDER_SITE_OTHER): Payer: Self-pay | Admitting: Adult Health

## 2023-05-03 ENCOUNTER — Encounter: Payer: Self-pay | Admitting: Adult Health

## 2023-05-03 ENCOUNTER — Other Ambulatory Visit: Payer: Self-pay

## 2023-05-03 VITALS — BP 100/80 | HR 74 | Temp 97.1°F | Ht 64.0 in | Wt 132.0 lb

## 2023-05-03 DIAGNOSIS — R051 Acute cough: Secondary | ICD-10-CM

## 2023-05-03 MED ORDER — ALBUTEROL SULFATE HFA 108 (90 BASE) MCG/ACT IN AERS
INHALATION_SPRAY | RESPIRATORY_TRACT | 0 refills | Status: AC
Start: 2023-05-03 — End: ?

## 2023-05-03 MED ORDER — BENZONATATE 100 MG PO CAPS
100.0000 mg | ORAL_CAPSULE | Freq: Three times a day (TID) | ORAL | 0 refills | Status: AC | PRN
Start: 2023-05-03 — End: ?

## 2023-05-03 NOTE — Progress Notes (Signed)
[facility] [address] [place], [place] [address]   Office Visit Note  Patient Name: Casey Hammond Date of Birth [id number]  Medical Record number [id number]  Date of Service: 05/03/2023  Chief Complaint  Patient presents with   Acute Visit    Patient c/o pressure in her chest, fatigue, L ear discomfort, and non-productive cough. Cough is worse at night. Denies fever. Symptoms began about one month ago at which time she saw her PCP for similar symptoms. She took a COVID test a few weeks ago which she states was negative. No known sick contacts.     HPI Pt is here for a sick visit. She reports feeling like she has "something in her chest".  She has cough, fatigue,  left ear pain has started.  Denies any fever, or chills.  She is taking Advil at times, and some [employer] brand nasal spray.  She also takes benadryl to help her sleep.    Current Medication:  Outpatient Encounter Medications as of 05/03/2023  Medication Sig   albuterol (VENTOLIN HFA) 108 (90 Base) MCG/ACT inhaler 2 puffs three times daily for the first 7 days.  After that you may use 2 puffs every 4-6 hours as needed.   benzonatate (TESSALON) 100 MG capsule Take 1-2 capsules (100-200 mg total) by mouth 3 (three) times daily as needed for cough.   cetirizine (ZYRTEC) 10 MG tablet Take 10 mg by mouth daily.   Cyanocobalamin (B-12 PO) Take by mouth daily.   fluticasone (FLONASE) 50 MCG/ACT nasal spray Place 2 sprays into both nostrils daily.   OnabotulinumtoxinA (BOTOX IJ) Inject as directed. Patient states she gets botox injections for migraine headaches.   sertraline (ZOLOFT) 100 MG tablet Take 1.5 tablets by mouth daily.   topiramate (TOPAMAX) 100 MG tablet Take 3 tablets by mouth daily.   Ubrogepant (UBRELVY PO) Take by mouth as needed. Patients states she takes for rescue treatment of migraines   VITAMIN D PO Take by mouth daily.   [DISCONTINUED] doxycycline (VIBRA-TABS) 100 MG tablet Take 1 tablet (100 mg  total) by mouth 2 (two) times daily.   [DISCONTINUED] fexofenadine (ALLEGRA) 180 MG tablet Take 180 mg by mouth daily. (Patient not taking: Reported on 09/18/2020)   No facility-administered encounter medications on file as of 05/03/2023.      Medical History: History reviewed. No pertinent past medical history.   Vital Signs: BP 100/80   Pulse 74   Temp (!) 97.1 F (36.2 C)   Ht 5\' 4"  (1.626 m)   Wt 132 lb (59.9 kg)   SpO2 98%   BMI 22.66 kg/m    Review of Systems  Constitutional:  Positive for fatigue. Negative for chills and fever.  HENT:  Positive for ear pain. Negative for sneezing, tinnitus and trouble swallowing.   Eyes:  Negative for pain and itching.  Respiratory:  Positive for cough.   Cardiovascular:  Negative for chest pain.  Gastrointestinal:  Negative for diarrhea, nausea and vomiting.    Physical Exam Vitals reviewed.  Constitutional:      Appearance: Normal appearance.  HENT:     Head: Normocephalic.     Right Ear: Tympanic membrane and ear canal normal.     Left Ear: Tympanic membrane and ear canal normal.  Neurological:     Mental Status: She is alert.       Assessment/Plan: 1. Acute cough Use albuterol as discussed, and take tessalon.  Follow up via MyChart messenger if symptoms fail  to improve or may return to clinic as needed for worsening symptoms.   - albuterol (VENTOLIN HFA) 108 (90 Base) MCG/ACT inhaler; 2 puffs three times daily for the first 7 days.  After that you may use 2 puffs every 4-6 hours as needed.  Dispense: 8 g; Refill: 0 - benzonatate (TESSALON) 100 MG capsule; Take 1-2 capsules (100-200 mg total) by mouth 3 (three) times daily as needed for cough.  Dispense: 30 capsule; Refill: 0     General Counseling: Jacolyn Reedy understanding of the findings of todays visit and agrees with plan of treatment. I have discussed any further diagnostic evaluation that may be needed or ordered today. We also reviewed her medications  today. she has been encouraged to call the office with any questions or concerns that should arise related to todays visit.   No orders of the defined types were placed in this encounter.   Meds ordered this encounter  Medications   albuterol (VENTOLIN HFA) 108 (90 Base) MCG/ACT inhaler    Sig: 2 puffs three times daily for the first 7 days.  After that you may use 2 puffs every 4-6 hours as needed.    Dispense:  8 g    Refill:  0   benzonatate (TESSALON) 100 MG capsule    Sig: Take 1-2 capsules (100-200 mg total) by mouth 3 (three) times daily as needed for cough.    Dispense:  30 capsule    Refill:  0    Time spent:20 Minutes    Johnna Acosta AGNP-C Nurse Practitioner

## 2023-07-04 ENCOUNTER — Ambulatory Visit (INDEPENDENT_AMBULATORY_CARE_PROVIDER_SITE_OTHER): Payer: Self-pay | Admitting: Physician Assistant

## 2023-07-04 ENCOUNTER — Other Ambulatory Visit: Payer: Self-pay

## 2023-07-04 ENCOUNTER — Encounter: Payer: Self-pay | Admitting: Physician Assistant

## 2023-07-04 VITALS — BP 110/70 | HR 88 | Temp 97.2°F | Ht 64.0 in | Wt 130.0 lb

## 2023-07-04 DIAGNOSIS — J0100 Acute maxillary sinusitis, unspecified: Secondary | ICD-10-CM

## 2023-07-04 MED ORDER — DOXYCYCLINE HYCLATE 100 MG PO TABS
100.0000 mg | ORAL_TABLET | Freq: Two times a day (BID) | ORAL | 0 refills | Status: AC
Start: 2023-07-04 — End: ?

## 2023-07-04 NOTE — Progress Notes (Signed)
[facility] [address] [place], [place] [address]   Office Visit Note  Patient Name: Casey Hammond Date of Birth [id number]  Medical Record number [id number]  Date of Service: 07/04/2023  Chief Complaint  Patient presents with   Acute Visit    Patient c/o headache, nasal congestion and runny nose, and sinus pressure. Symptoms began about one week ago and have waxed and waned since then. They recently traveled to [place] and her husband and daughter also have similar symptoms. She took OTC Advil Sinus medication which has been slightly helpful.     57 y/o F presents to the clinic for c/o sinus pressure, pain, post nasal drainage, nasal congestion, and overall not feeling well due to her sinus pain x 1week. She has been taking otc Tylenol sinus congestion medicine as well as using nasal spray with no improvement for the past week.       Current Medication:  Outpatient Encounter Medications as of 07/04/2023  Medication Sig   albuterol (VENTOLIN HFA) 108 (90 Base) MCG/ACT inhaler 2 puffs three times daily for the first 7 days.  After that you may use 2 puffs every 4-6 hours as needed.   benzonatate (TESSALON) 100 MG capsule Take 1-2 capsules (100-200 mg total) by mouth 3 (three) times daily as needed for cough.   cetirizine (ZYRTEC) 10 MG tablet Take 10 mg by mouth daily.   Cyanocobalamin (B-12 PO) Take by mouth daily.   doxycycline (VIBRA-TABS) 100 MG tablet Take 1 tablet (100 mg total) by mouth 2 (two) times daily.   fluticasone (FLONASE) 50 MCG/ACT nasal spray Place 2 sprays into both nostrils daily.   OnabotulinumtoxinA (BOTOX IJ) Inject as directed. Patient states she gets botox injections for migraine headaches.   sertraline (ZOLOFT) 100 MG tablet Take 1.5 tablets by mouth daily.   topiramate (TOPAMAX) 100 MG tablet Take 3 tablets by mouth daily.   Ubrogepant (UBRELVY PO) Take by mouth as needed. Patients states she takes for rescue treatment of migraines   VITAMIN  D PO Take by mouth daily.   No facility-administered encounter medications on file as of 07/04/2023.      Medical History: History reviewed. No pertinent past medical history.   Vital Signs: BP 110/70   Pulse 88   Temp (!) 97.2 F (36.2 C)   Ht 5\' 4"  (1.626 m)   Wt 130 lb (59 kg)   SpO2 96%   BMI 22.31 kg/m    Review of Systems  Constitutional: Negative.   HENT:  Positive for congestion, postnasal drip, rhinorrhea, sinus pressure and sinus pain. Negative for sore throat and trouble swallowing.   Respiratory: Negative.    Cardiovascular: Negative.   Neurological: Negative.     Physical Exam Constitutional:      Appearance: Normal appearance.  HENT:     Head: Atraumatic.     Right Ear: Tympanic membrane, ear canal and external ear normal.     Left Ear: Tympanic membrane, ear canal and external ear normal.     Nose:     Right Sinus: Maxillary sinus tenderness present. No frontal sinus tenderness.     Left Sinus: Maxillary sinus tenderness present. No frontal sinus tenderness.     Mouth/Throat:     Mouth: Mucous membranes are moist.     Pharynx: Oropharynx is clear.  Eyes:     Extraocular Movements: Extraocular movements intact.  Cardiovascular:     Rate and Rhythm: Normal rate and regular rhythm.  Pulmonary:  Effort: Pulmonary effort is normal.     Breath sounds: Normal breath sounds.  Musculoskeletal:     Cervical back: Neck supple.  Skin:    General: Skin is warm.  Neurological:     Mental Status: She is alert.  Psychiatric:        Mood and Affect: Mood normal.        Behavior: Behavior normal.        Thought Content: Thought content normal.        Judgment: Judgment normal.       Assessment/Plan:  1. Acute non-recurrent maxillary sinusitis - doxycycline (VIBRA-TABS) 100 MG tablet; Take 1 tablet (100 mg total) by mouth 2 (two) times daily.  Dispense: 14 tablet; Refill: 0  Increase fluids Pt is unable to tolerate Sudafed or steroids due to side  effects. Will start Rx Doxycycline Use nasal decongestant ie Aafrin for maximum of 3 days.  Pt verbalized understanding and in agreement.   General Counseling: Jacolyn Reedy understanding of the findings of todays visit and agrees with plan of treatment. I have discussed any further diagnostic evaluation that may be needed or ordered today. We also reviewed her medications today. she has been encouraged to call the office with any questions or concerns that should arise related to todays visit.    Time spent:20 Minutes    Gilberto Better, [place] Physician Assistant

## 2023-12-14 ENCOUNTER — Other Ambulatory Visit: Payer: Self-pay | Admitting: Obstetrics and Gynecology

## 2023-12-14 DIAGNOSIS — R928 Other abnormal and inconclusive findings on diagnostic imaging of breast: Secondary | ICD-10-CM

## 2023-12-28 ENCOUNTER — Ambulatory Visit
Admission: RE | Admit: 2023-12-28 | Discharge: 2023-12-28 | Disposition: A | Discharge status: Home / Self Care | Source: Ambulatory Visit | Attending: Obstetrics and Gynecology | Admitting: Obstetrics and Gynecology

## 2023-12-28 DIAGNOSIS — R928 Other abnormal and inconclusive findings on diagnostic imaging of breast: Secondary | ICD-10-CM
